# Patient Record
Sex: Female | Born: 1955 | Race: White | Hispanic: No | Marital: Married | Smoking: Never smoker
Health system: Southern US, Community
[De-identification: ages and names within clinical notes are randomized; demographics above are authoritative.]

## PROBLEM LIST (undated history)

## (undated) DIAGNOSIS — I10 Essential (primary) hypertension: Secondary | ICD-10-CM

## (undated) DIAGNOSIS — I509 Heart failure, unspecified: Secondary | ICD-10-CM

## (undated) DIAGNOSIS — E039 Hypothyroidism, unspecified: Secondary | ICD-10-CM

## (undated) HISTORY — DX: Essential (primary) hypertension: I10

---

## 2005-08-30 ENCOUNTER — Ambulatory Visit: Payer: Self-pay | Admitting: Urology

## 2007-02-07 ENCOUNTER — Ambulatory Visit: Payer: Self-pay | Admitting: Unknown Physician Specialty

## 2009-02-12 ENCOUNTER — Ambulatory Visit: Payer: Self-pay | Admitting: Urology

## 2010-02-07 ENCOUNTER — Ambulatory Visit: Payer: Self-pay | Admitting: Internal Medicine

## 2010-02-08 ENCOUNTER — Ambulatory Visit: Payer: Self-pay | Admitting: Family Medicine

## 2010-02-10 ENCOUNTER — Ambulatory Visit: Payer: Self-pay | Admitting: Internal Medicine

## 2010-02-14 ENCOUNTER — Ambulatory Visit: Payer: Self-pay | Admitting: Internal Medicine

## 2011-01-26 ENCOUNTER — Ambulatory Visit: Payer: Self-pay | Admitting: Urology

## 2016-03-08 ENCOUNTER — Other Ambulatory Visit: Payer: Self-pay

## 2016-03-08 DIAGNOSIS — M810 Age-related osteoporosis without current pathological fracture: Secondary | ICD-10-CM

## 2016-03-08 NOTE — Telephone Encounter (Signed)
Pt calling.  Has appt sched. 3/28.  Is on Evista for her bones.  She has changed jobs and rx has run out.  Can she have refill this this month?

## 2016-03-10 DIAGNOSIS — M810 Age-related osteoporosis without current pathological fracture: Secondary | ICD-10-CM | POA: Insufficient documentation

## 2016-03-10 MED ORDER — RALOXIFENE HCL 60 MG PO TABS: 60.0000 mg | ORAL_TABLET | Freq: Every day | ORAL | 0 refills | Status: DC

## 2016-03-10 NOTE — Addendum Note (Signed)
Addended by: Nadara MustardHARRIS, Raelea Gosse P on: 03/10/2016 01:11 PM   Modules accepted: Orders

## 2016-03-10 NOTE — Telephone Encounter (Signed)
[facility] [place]

## 2016-03-10 NOTE — Telephone Encounter (Signed)
No pharmacy so printed to fax once find out pharmacy

## 2016-03-10 NOTE — Telephone Encounter (Signed)
renewed

## 2016-03-31 ENCOUNTER — Encounter: Payer: Self-pay | Admitting: Obstetrics & Gynecology

## 2016-03-31 ENCOUNTER — Ambulatory Visit (INDEPENDENT_AMBULATORY_CARE_PROVIDER_SITE_OTHER): Payer: BC Managed Care – PPO | Admitting: Obstetrics & Gynecology

## 2016-03-31 VITALS — BP 140/90 | HR 66 | Ht 65.0 in | Wt 179.0 lb

## 2016-03-31 DIAGNOSIS — Z1239 Encounter for other screening for malignant neoplasm of breast: Secondary | ICD-10-CM

## 2016-03-31 DIAGNOSIS — Z1231 Encounter for screening mammogram for malignant neoplasm of breast: Secondary | ICD-10-CM

## 2016-03-31 DIAGNOSIS — M8580 Other specified disorders of bone density and structure, unspecified site: Secondary | ICD-10-CM

## 2016-03-31 DIAGNOSIS — M810 Age-related osteoporosis without current pathological fracture: Secondary | ICD-10-CM

## 2016-03-31 DIAGNOSIS — Z1211 Encounter for screening for malignant neoplasm of colon: Secondary | ICD-10-CM

## 2016-03-31 DIAGNOSIS — Z0000 Encounter for general adult medical examination without abnormal findings: Secondary | ICD-10-CM | POA: Diagnosis not present

## 2016-03-31 MED ORDER — RALOXIFENE HCL 60 MG PO TABS: 60.0000 mg | ORAL_TABLET | Freq: Every day | ORAL | 12 refills | Status: DC

## 2016-03-31 NOTE — Progress Notes (Signed)
HPI:      [name] is a 61 y.o. [id number] who LMP was in the past, she presents today for her annual examination.  The patient has no complaints today. The patient is sexually active. 2016 last pap: was normal. The patient is not taking hormone replacement therapy. Patient denies post-menopausal vaginal bleeding.   The patient has regular exercise: yes.   GYN Hx: Last Colonoscopy:9 years ago. Normal. Dr Markham Jordan Last DEXA: 3 years ago.  Osteopenia hip, normal spine.  PMHx: She  has no past medical history on file. Also,  has no past surgical history on file., family history includes Lung cancer in her mother.,  reports that she has never smoked. She has never used smokeless tobacco. She reports that she does not drink alcohol or use drugs.  She has a current medication list which includes the following prescription(s): calcium carbonate, cholecalciferol, garlic, omega-3 fatty acids, and raloxifene. Also, has No Known Allergies.  Review of Systems  Constitutional: Negative for chills, fever and malaise/fatigue.  HENT: Negative for congestion, sinus pain and sore throat.   Eyes: Negative for blurred vision and pain.  Respiratory: Negative for cough and wheezing.   Cardiovascular: Negative for chest pain and leg swelling.  Gastrointestinal: Negative for abdominal pain, constipation, diarrhea, heartburn, nausea and vomiting.  Genitourinary: Negative for dysuria, frequency, hematuria and urgency.  Musculoskeletal: Negative for back pain, joint pain, myalgias and neck pain.  Skin: Negative for itching and rash.  Neurological: Negative for dizziness, tremors and weakness.  Endo/Heme/Allergies: Does not bruise/bleed easily.  Psychiatric/Behavioral: Negative for depression. The patient is not nervous/anxious and does not have insomnia.     Objective: BP 140/90   Pulse 66   Ht 5\' 5"  (1.651 m)   Wt 179 lb (81.2 kg)   BMI 29.79 kg/m  Physical Exam  Constitutional: She is oriented  to person, place, and time. She appears well-developed and well-nourished. No distress.  Genitourinary: Rectum normal, vagina normal and uterus normal. Pelvic exam was performed with patient supine. There is no rash or lesion on the right labia. There is no rash or lesion on the left labia. Vagina exhibits no lesion. No bleeding in the vagina. Right adnexum does not display mass and does not display tenderness. Left adnexum does not display mass and does not display tenderness. Cervix does not exhibit motion tenderness, lesion, friability or polyp.   Uterus is mobile and midaxial. Uterus is not enlarged or exhibiting a mass.  HENT:  Head: Normocephalic and atraumatic. Head is without laceration.  Right Ear: Hearing normal.  Left Ear: Hearing normal.  Nose: No epistaxis.  No foreign bodies.  Mouth/Throat: Uvula is midline, oropharynx is clear and moist and mucous membranes are normal.  Eyes: Pupils are equal, round, and reactive to light.  Neck: Normal range of motion. Neck supple. No thyromegaly present.  Cardiovascular: Normal rate and regular rhythm.  Exam reveals no gallop and no friction rub.   No murmur heard. Pulmonary/Chest: Effort normal and breath sounds normal. No respiratory distress. She has no wheezes. Right breast exhibits no mass, no skin change and no tenderness. Left breast exhibits no mass, no skin change and no tenderness.  Abdominal: Soft. Bowel sounds are normal. She exhibits no distension. There is no tenderness. There is no rebound.  Musculoskeletal: Normal range of motion.  Neurological: She is alert and oriented to person, place, and time. No cranial nerve deficit.  Skin: Skin is warm and dry.  Psychiatric: She has a  normal mood and affect. Judgment normal.  Vitals reviewed.   Assessment: Annual Exam 1. Annual physical exam   2. Osteopenia, unspecified location   3. Osteoporosis, unspecified osteoporosis type, unspecified pathological fracture presence   4.  Screening for breast cancer   5. Screen for colon cancer    Plan:            1.  Cervical Screening-  Pap smear schedule reviewed with patient, due 2019  2. Breast screening- Exam annually and mammogram scheduled  3. Colonoscopy every 10 years (2019), Hemoccult testing after age 61  4. Labs  per PCP  5. Counseling for hormonal therapy: none  6. DEXA, if all normal then stop Evista.  Pros and cons Evista discussed.  Rx today until results.  Cont Vit D and Calcium.    F/U  Return in about 1 year (around 03/31/2017) for Annual.  Annamarie MajorPaul Dawon Troop, MD, Merlinda FrederickFACOG [facility], [facility] 03/31/2016  4:16 PM

## 2016-04-26 ENCOUNTER — Encounter: Payer: Self-pay | Admitting: Obstetrics & Gynecology

## 2016-05-18 ENCOUNTER — Telehealth: Payer: Self-pay

## 2016-05-18 NOTE — Telephone Encounter (Signed)
Pt calling for results from bone density scan. [facility] Please advise. cb# [phone] thank you!

## 2016-05-19 NOTE — Telephone Encounter (Signed)
Letter sent on 04/27/16 (confirmed w Tonya), did she get this?  Does she have correct address in system if not? Please check on this. Results- The results of your recent DEXA bone scan reveals similar findings to 3 years ago, with osteopenia in the hip and normal spine, and this is good news that it has not progressed, yet probably means you would still benefit from Evista therapy.    Recommendations are for continued regular exercise and adequate calcium and Vitamin D supplementation as well.

## 2016-05-19 NOTE — Telephone Encounter (Signed)
Left message for to call back. 

## 2016-05-20 NOTE — Telephone Encounter (Signed)
Pt aware.

## 2017-03-07 ENCOUNTER — Other Ambulatory Visit: Payer: Self-pay | Admitting: Obstetrics & Gynecology

## 2017-03-07 DIAGNOSIS — M810 Age-related osteoporosis without current pathological fracture: Secondary | ICD-10-CM

## 2017-07-19 ENCOUNTER — Other Ambulatory Visit: Payer: Self-pay | Admitting: Obstetrics & Gynecology

## 2017-07-19 ENCOUNTER — Telehealth: Payer: Self-pay | Admitting: Obstetrics & Gynecology

## 2017-07-19 NOTE — Progress Notes (Signed)
Mammogram results reviewed, reassured.  Letter sent.

## 2017-07-19 NOTE — Telephone Encounter (Signed)
Patient is [facility]

## 2017-07-19 NOTE — Telephone Encounter (Signed)
-----   Message from Nadara Mustardobert P Harris, MD sent at 07/19/2017  9:12 AM EDT ----- Regarding: [facility]

## 2017-08-01 ENCOUNTER — Encounter: Payer: Self-pay | Admitting: Obstetrics & Gynecology

## 2017-08-01 ENCOUNTER — Other Ambulatory Visit (HOSPITAL_COMMUNITY)
Admission: RE | Admit: 2017-08-01 | Discharge: 2017-08-01 | Disposition: A | Discharge status: Home / Self Care | Payer: BC Managed Care – PPO | Source: Ambulatory Visit | Attending: Obstetrics & Gynecology | Admitting: Obstetrics & Gynecology

## 2017-08-01 ENCOUNTER — Ambulatory Visit (INDEPENDENT_AMBULATORY_CARE_PROVIDER_SITE_OTHER): Payer: BC Managed Care – PPO | Admitting: Obstetrics & Gynecology

## 2017-08-01 VITALS — BP 158/90 | HR 79 | Ht 65.0 in | Wt 180.0 lb

## 2017-08-01 DIAGNOSIS — Z01411 Encounter for gynecological examination (general) (routine) with abnormal findings: Secondary | ICD-10-CM

## 2017-08-01 DIAGNOSIS — Z0000 Encounter for general adult medical examination without abnormal findings: Secondary | ICD-10-CM

## 2017-08-01 DIAGNOSIS — Z124 Encounter for screening for malignant neoplasm of cervix: Secondary | ICD-10-CM | POA: Insufficient documentation

## 2017-08-01 DIAGNOSIS — M810 Age-related osteoporosis without current pathological fracture: Secondary | ICD-10-CM

## 2017-08-01 DIAGNOSIS — Z1239 Encounter for other screening for malignant neoplasm of breast: Secondary | ICD-10-CM

## 2017-08-01 DIAGNOSIS — Z1231 Encounter for screening mammogram for malignant neoplasm of breast: Secondary | ICD-10-CM | POA: Diagnosis not present

## 2017-08-01 MED ORDER — RALOXIFENE HCL 60 MG PO TABS: 60.0000 mg | ORAL_TABLET | Freq: Every day | ORAL | 4 refills | Status: DC

## 2017-08-01 NOTE — Progress Notes (Signed)
HPI:      [name] is a 62 y.o. [id number] who LMP was in the past, she presents today for her annual examination.  The patient has no complaints today. The patient is sexually active. Herlast pap: approximate date 2016 and was normal and last mammogram: approximate date 2019 and was normal.  The patient does perform self breast exams.  There is no notable family history of breast or ovarian cancer in her family. The patient is not taking hormone replacement therapy. Patient denies post-menopausal vaginal bleeding.   The patient has regular exercise: yes. The patient denies current symptoms of depression.    GYN Hx: Last Colonoscopy:10 years ago. Normal.  Last DEXA: year ago.    PMHx: Past Medical History:  Diagnosis Date  . Hypertension    History reviewed. No pertinent surgical history. Family History  Problem Relation Age of Onset  . Lung cancer Mother    Social History   Tobacco Use  . Smoking status: Never Smoker  . Smokeless tobacco: Never Used  Substance Use Topics  . Alcohol use: No  . Drug use: No    Current Outpatient Medications:  .  calcium carbonate (CALCIUM 600) 600 MG TABS tablet, Take by mouth., Disp: , Rfl:  .  Cholecalciferol (VITAMIN D-1000 MAX ST) 1000 units tablet, Take by mouth., Disp: , Rfl:  .  Garlic 10 MG CAPS, Take by mouth., Disp: , Rfl:  .  hydrochlorothiazide (HYDRODIURIL) 25 MG tablet, Take by mouth., Disp: , Rfl:  .  Omega-3 Fatty Acids (FISH OIL PO), Take by mouth., Disp: , Rfl:  .  raloxifene (EVISTA) 60 MG tablet, Take 1 tablet (60 mg total) by mouth daily., Disp: 90 tablet, Rfl: 4 .  Cyanocobalamin (B-12) 1000 MCG LOZG, Place under the tongue., Disp: , Rfl:  Allergies: Patient has no known allergies.  Review of Systems  Constitutional: Negative for chills, fever and malaise/fatigue.  HENT: Negative for congestion, sinus pain and sore throat.   Eyes: Negative for blurred vision and pain.  Respiratory: Negative for cough and  wheezing.   Cardiovascular: Negative for chest pain and leg swelling.  Gastrointestinal: Negative for abdominal pain, constipation, diarrhea, heartburn, nausea and vomiting.  Genitourinary: Negative for dysuria, frequency, hematuria and urgency.  Musculoskeletal: Negative for back pain, joint pain, myalgias and neck pain.  Skin: Negative for itching and rash.  Neurological: Negative for dizziness, tremors and weakness.  Endo/Heme/Allergies: Does not bruise/bleed easily.  Psychiatric/Behavioral: Negative for depression. The patient is not nervous/anxious and does not have insomnia.    Objective: BP (!) 158/90   Pulse 79   Ht 5\' 5"  (1.651 m)   Wt 180 lb (81.6 kg)   BMI 29.95 kg/m   Filed Weights   08/01/17 0805  Weight: 180 lb (81.6 kg)   Body mass index is 29.95 kg/m. Physical Exam  Constitutional: She is oriented to person, place, and time. She appears well-developed and well-nourished. No distress.  Genitourinary: Rectum normal, vagina normal and uterus normal. Pelvic exam was performed with patient supine. There is no rash or lesion on the right labia. There is no rash or lesion on the left labia. Vagina exhibits no lesion. No bleeding in the vagina. Right adnexum does not display mass and does not display tenderness. Left adnexum does not display mass and does not display tenderness. Cervix does not exhibit motion tenderness, lesion, friability or polyp.   Uterus is mobile and midaxial. Uterus is not enlarged or exhibiting a mass.  HENT:  Head: Normocephalic and atraumatic. Head is without laceration.  Right Ear: Hearing normal.  Left Ear: Hearing normal.  Nose: No epistaxis.  No foreign bodies.  Mouth/Throat: Uvula is midline, oropharynx is clear and moist and mucous membranes are normal.  Eyes: Pupils are equal, round, and reactive to light.  Neck: Normal range of motion. Neck supple. No thyromegaly present.  Cardiovascular: Normal rate and regular rhythm. Exam reveals no  gallop and no friction rub.  No murmur heard. Pulmonary/Chest: Effort normal and breath sounds normal. No respiratory distress. She has no wheezes. Right breast exhibits no mass, no skin change and no tenderness. Left breast exhibits no mass, no skin change and no tenderness.  Abdominal: Soft. Bowel sounds are normal. She exhibits no distension. There is no tenderness. There is no rebound.  Musculoskeletal: Normal range of motion.  Neurological: She is alert and oriented to person, place, and time. No cranial nerve deficit.  Skin: Skin is warm and dry.  Psychiatric: She has a normal mood and affect. Judgment normal.  Vitals reviewed.  Assessment:  1. Annual physical exam   2. Screening for cervical cancer   3. Screening for breast cancer   4. Osteoporosis, unspecified osteoporosis type, unspecified pathological fracture presence    Plan:            1.  Cervical Screening-  Pap smear done today  2. Breast screening- Exam annually and mammogram scheduled  3. Colonoscopy every 10 years, Hemoccult testing after age 62  4. Labs managed by PCP  5. Counseling for hormonal therapy: none  6. White coat HTN.  Sees PCP.  Recent HCTZ.  7. Osteopenia.  DEXA last year.  Cont Evista. No SE or concerns.     F/U  Return in about 1 year (around 08/02/2018) for Annual.  Annamarie MajorPaul Ocean Schildt, MD, Merlinda FrederickFACOG [facility], [facility] 08/01/2017  8:23 AM

## 2017-08-01 NOTE — Patient Instructions (Addendum)
PAP every three years Mammogram every year Colonoscopy every 10 years Labs yearly (with PCP)   

## 2017-08-03 LAB — CYTOLOGY - PAP
DIAGNOSIS: NEGATIVE
HPV (WINDOPATH): NOT DETECTED

## 2018-08-14 ENCOUNTER — Ambulatory Visit (INDEPENDENT_AMBULATORY_CARE_PROVIDER_SITE_OTHER): Payer: BC Managed Care – PPO | Admitting: Obstetrics & Gynecology

## 2018-08-14 ENCOUNTER — Other Ambulatory Visit: Payer: Self-pay

## 2018-08-14 ENCOUNTER — Encounter: Payer: Self-pay | Admitting: Obstetrics & Gynecology

## 2018-08-14 VITALS — BP 130/90 | Ht 66.0 in | Wt 197.0 lb

## 2018-08-14 DIAGNOSIS — Z01419 Encounter for gynecological examination (general) (routine) without abnormal findings: Secondary | ICD-10-CM

## 2018-08-14 DIAGNOSIS — M810 Age-related osteoporosis without current pathological fracture: Secondary | ICD-10-CM

## 2018-08-14 DIAGNOSIS — M8580 Other specified disorders of bone density and structure, unspecified site: Secondary | ICD-10-CM

## 2018-08-14 DIAGNOSIS — Z1211 Encounter for screening for malignant neoplasm of colon: Secondary | ICD-10-CM

## 2018-08-14 MED ORDER — RALOXIFENE HCL 60 MG PO TABS: 60.0000 mg | ORAL_TABLET | Freq: Every day | ORAL | 3 refills | Status: DC

## 2018-08-14 NOTE — Patient Instructions (Signed)
PAP every three years Mammogram every year, normal this year Colonoscopy every 10 years, up to date 2019 Labs yearly (with PCP)

## 2018-08-14 NOTE — Progress Notes (Signed)
HPI:      [name] is a 63 y.o. [id number] who LMP was in the past, she presents today for her annual examination.  The patient has no complaints today. The patient is sexually active. Herlast pap: approximate date 2019 and was normal and last mammogram: approximate date 2020 and was normal.  The patient does perform self breast exams.  There is no notable family history of breast or ovarian cancer in her family. The patient is not taking hormone replacement therapy. Patient denies post-menopausal vaginal bleeding.   The patient has regular exercise: yes. The patient denies current symptoms of depression.    GYN Hx: Last Colonoscopy:1 year ago. Normal.  Last DEXA: 2 years ago.    PMHx: Past Medical History:  Diagnosis Date  . Hypertension    History reviewed. No pertinent surgical history. Family History  Problem Relation Age of Onset  . Lung cancer Mother    Social History   Tobacco Use  . Smoking status: Never Smoker  . Smokeless tobacco: Never Used  Substance Use Topics  . Alcohol use: No  . Drug use: No    Current Outpatient Medications:  .  calcium carbonate (CALCIUM 600) 600 MG TABS tablet, Take by mouth., Disp: , Rfl:  .  Cholecalciferol (VITAMIN D-1000 MAX ST) 1000 units tablet, Take by mouth., Disp: , Rfl:  .  hydrochlorothiazide (HYDRODIURIL) 25 MG tablet, Take by mouth., Disp: , Rfl:  .  metoprolol succinate (TOPROL-XL) 50 MG 24 hr tablet, Take by mouth., Disp: , Rfl:  .  raloxifene (EVISTA) 60 MG tablet, Take 1 tablet (60 mg total) by mouth daily., Disp: 90 tablet, Rfl: 3 .  Cyanocobalamin (B-12) 1000 MCG LOZG, Place under the tongue., Disp: , Rfl:  .  Garlic 10 MG CAPS, Take by mouth., Disp: , Rfl:  .  hydrochlorothiazide (HYDRODIURIL) 25 MG tablet, Take by mouth., Disp: , Rfl:  .  Omega-3 Fatty Acids (FISH OIL PO), Take by mouth., Disp: , Rfl:  Allergies: Patient has no known allergies.  Review of Systems  Constitutional: Negative for chills, fever  and malaise/fatigue.  HENT: Negative for congestion, sinus pain and sore throat.   Eyes: Negative for blurred vision and pain.  Respiratory: Negative for cough and wheezing.   Cardiovascular: Negative for chest pain and leg swelling.  Gastrointestinal: Negative for abdominal pain, constipation, diarrhea, heartburn, nausea and vomiting.  Genitourinary: Negative for dysuria, frequency, hematuria and urgency.  Musculoskeletal: Negative for back pain, joint pain, myalgias and neck pain.  Skin: Negative for itching and rash.  Neurological: Negative for dizziness, tremors and weakness.  Endo/Heme/Allergies: Does not bruise/bleed easily.  Psychiatric/Behavioral: Negative for depression. The patient is not nervous/anxious and does not have insomnia.     Objective: BP 130/90   Ht 5\' 6"  (1.676 m)   Wt 197 lb (89.4 kg)   BMI 31.80 kg/m   Filed Weights   08/14/18 0805  Weight: 197 lb (89.4 kg)   Body mass index is 31.8 kg/m. Physical Exam Constitutional:      General: She is not in acute distress.    Appearance: She is well-developed.  Genitourinary:     Pelvic exam was performed with patient supine.     Vagina, uterus and rectum normal.     No lesions in the vagina.     No vaginal bleeding.     No cervical motion tenderness, friability, lesion or polyp.     Uterus is mobile.     Uterus  is not enlarged.     No uterine mass detected.    Uterus is midaxial.     No right or left adnexal mass present.     Right adnexa not tender.     Left adnexa not tender.  HENT:     Head: Normocephalic and atraumatic. No laceration.     Right Ear: Hearing normal.     Left Ear: Hearing normal.     Mouth/Throat:     Pharynx: Uvula midline.  Eyes:     Pupils: Pupils are equal, round, and reactive to light.  Neck:     Musculoskeletal: Normal range of motion and neck supple.     Thyroid: No thyromegaly.  Cardiovascular:     Rate and Rhythm: Normal rate and regular rhythm.     Heart sounds: No  murmur. No friction rub. No gallop.   Pulmonary:     Effort: Pulmonary effort is normal. No respiratory distress.     Breath sounds: Normal breath sounds. No wheezing.  Chest:     Breasts:        Right: No mass, skin change or tenderness.        Left: No mass, skin change or tenderness.  Abdominal:     General: Bowel sounds are normal. There is no distension.     Palpations: Abdomen is soft.     Tenderness: There is no abdominal tenderness. There is no rebound.  Musculoskeletal: Normal range of motion.  Neurological:     Mental Status: She is alert and oriented to person, place, and time.     Cranial Nerves: No cranial nerve deficit.  Skin:    General: Skin is warm and dry.  Psychiatric:        Judgment: Judgment normal.  Vitals signs reviewed.     Assessment: Annual Exam 1. Women's annual routine gynecological examination   2. Screen for colon cancer   3. Osteopenia, unspecified location   4. Osteoporosis, unspecified osteoporosis type, unspecified pathological fracture presence     Plan:            1.  Cervical Screening-  Pap smear schedule reviewed with patient  2. Breast screening- Exam annually and mammogram scheduled  3. Colonoscopy every 10 years, Hemoccult testing after age 63  4. Labs managed by PCP  5. Counseling for hormonal therapy: no change in therapy today              6. FRAX - FRAX score for assessing the 10 year probability for fracture calculated and discussed today.  Based on age and score today, DEXA is not currently scheduled.   Will scheduled next year.   7. Osteopenia, unspecified location - raloxifene (EVISTA) 60 MG tablet; Take 1 tablet (60 mg total) by mouth daily.  Dispense: 90 tablet; Refill: 3 - Cont this therapy as has helped over recent years.  Recheck DEXA next year (pt prefers to wait due to [place], and this is OK as we are on maintenance therapy that has been proven to work thru past DEXA (5 yrs ago, then 2 yrs ago)   8.  HTN, sees  PCP Dr Hyacinth MeekerMiller, on trial of medicine for this    F/U  Return in about 1 year (around 08/14/2019) for Annual.  Annamarie MajorPaul Patrich Heinze, MD, Merlinda FrederickFACOG [facility], [facility] 08/14/2018  8:25 AM

## 2018-10-11 ENCOUNTER — Ambulatory Visit
Admission: RE | Admit: 2018-10-11 | Discharge: 2018-10-11 | Disposition: A | Discharge status: Home / Self Care | Payer: BC Managed Care – PPO | Source: Ambulatory Visit | Attending: Internal Medicine | Admitting: Internal Medicine

## 2018-10-11 ENCOUNTER — Other Ambulatory Visit (HOSPITAL_COMMUNITY): Payer: Self-pay | Admitting: Internal Medicine

## 2018-10-11 ENCOUNTER — Other Ambulatory Visit: Payer: Self-pay | Admitting: Internal Medicine

## 2018-10-11 ENCOUNTER — Other Ambulatory Visit: Payer: Self-pay

## 2018-10-11 DIAGNOSIS — R1011 Right upper quadrant pain: Secondary | ICD-10-CM | POA: Insufficient documentation

## 2018-10-20 ENCOUNTER — Other Ambulatory Visit: Payer: Self-pay | Admitting: Internal Medicine

## 2018-10-20 DIAGNOSIS — R1011 Right upper quadrant pain: Secondary | ICD-10-CM

## 2018-10-26 ENCOUNTER — Other Ambulatory Visit: Payer: Self-pay

## 2018-10-26 ENCOUNTER — Encounter
Admission: RE | Admit: 2018-10-26 | Discharge: 2018-10-26 | Disposition: A | Discharge status: Home / Self Care | Payer: BC Managed Care – PPO | Source: Ambulatory Visit | Attending: Internal Medicine | Admitting: Internal Medicine

## 2018-10-26 DIAGNOSIS — R1011 Right upper quadrant pain: Secondary | ICD-10-CM | POA: Insufficient documentation

## 2018-10-26 MED ORDER — TECHNETIUM TC 99M MEBROFENIN IV KIT
5.0000 | PACK | Freq: Once | INTRAVENOUS | Status: AC | PRN
  Administered 2018-10-26: 13:00:00 5.09 via INTRAVENOUS

## 2019-07-03 ENCOUNTER — Other Ambulatory Visit: Payer: Self-pay | Admitting: Pulmonary Disease

## 2019-07-10 ENCOUNTER — Other Ambulatory Visit (HOSPITAL_COMMUNITY): Payer: Self-pay | Admitting: Pulmonary Disease

## 2019-07-10 ENCOUNTER — Other Ambulatory Visit: Payer: Self-pay | Admitting: Pulmonary Disease

## 2019-07-10 DIAGNOSIS — J849 Interstitial pulmonary disease, unspecified: Secondary | ICD-10-CM

## 2019-07-20 ENCOUNTER — Other Ambulatory Visit: Payer: Self-pay

## 2019-07-20 ENCOUNTER — Ambulatory Visit
Admission: RE | Admit: 2019-07-20 | Discharge: 2019-07-20 | Disposition: A | Discharge status: Home / Self Care | Payer: BC Managed Care – PPO | Source: Ambulatory Visit | Attending: Pulmonary Disease | Admitting: Pulmonary Disease

## 2019-07-20 DIAGNOSIS — J849 Interstitial pulmonary disease, unspecified: Secondary | ICD-10-CM

## 2019-07-20 MED ORDER — IOHEXOL 300 MG/ML  SOLN
75.0000 mL | Freq: Once | INTRAMUSCULAR | Status: AC | PRN
  Administered 2019-07-20: 75 mL via INTRAVENOUS

## 2019-10-02 ENCOUNTER — Other Ambulatory Visit: Payer: Self-pay

## 2019-10-02 ENCOUNTER — Encounter: Payer: Self-pay | Admitting: Obstetrics & Gynecology

## 2019-10-02 ENCOUNTER — Ambulatory Visit (INDEPENDENT_AMBULATORY_CARE_PROVIDER_SITE_OTHER): Payer: BC Managed Care – PPO | Admitting: Obstetrics & Gynecology

## 2019-10-02 VITALS — BP 130/98 | Ht 65.0 in | Wt 194.0 lb

## 2019-10-02 DIAGNOSIS — Z01419 Encounter for gynecological examination (general) (routine) without abnormal findings: Secondary | ICD-10-CM | POA: Diagnosis not present

## 2019-10-02 DIAGNOSIS — M810 Age-related osteoporosis without current pathological fracture: Secondary | ICD-10-CM

## 2019-10-02 NOTE — Progress Notes (Signed)
HPI:      [name] is a 64 y.o. [id number] who LMP was in the past, she presents today for her annual examination.  The patient has no complaints today. The patient is sexually active. Herlast pap: approximate date 2019 and was normal and last mammogram: approximate date 2021 and was normal and this was at [facility].  The patient does perform self breast exams.  There is no notable family history of breast or ovarian cancer in her family. The patient is not taking hormone replacement therapy. Patient denies post-menopausal vaginal bleeding.   The patient has regular exercise: yes. The patient denies current symptoms of depression.    GYN Hx: Last Colonoscopy:2 years ago. Normal. Due every 5 yrs Last DEXA: 3 years ago.  On Evista for osteoporosis.  PMHx: Past Medical History:  Diagnosis Date  . Hypertension    History reviewed. No pertinent surgical history. Family History  Problem Relation Age of Onset  . Lung cancer Mother    Social History   Tobacco Use  . Smoking status: Never Smoker  . Smokeless tobacco: Never Used  Vaping Use  . Vaping Use: Never used  Substance Use Topics  . Alcohol use: No  . Drug use: No    Current Outpatient Medications:  .  calcium carbonate (CALCIUM 600) 600 MG TABS tablet, Take by mouth., Disp: , Rfl:  .  Cholecalciferol (VITAMIN D-1000 MAX ST) 1000 units tablet, Take by mouth., Disp: , Rfl:  .  Cyanocobalamin (B-12) 1000 MCG LOZG, Place under the tongue., Disp: , Rfl:  .  Garlic 10 MG CAPS, Take by mouth., Disp: , Rfl:  .  hydrochlorothiazide (HYDRODIURIL) 25 MG tablet, Take by mouth., Disp: , Rfl:  .  levothyroxine (SYNTHROID) 50 MCG tablet, Take by mouth., Disp: , Rfl:  .  raloxifene (EVISTA) 60 MG tablet, Take 1 tablet (60 mg total) by mouth daily., Disp: 90 tablet, Rfl: 3 .  hydrochlorothiazide (HYDRODIURIL) 25 MG tablet, Take by mouth., Disp: , Rfl:  .  metoprolol succinate (TOPROL-XL) 50 MG 24 hr tablet, Take by mouth., Disp: , Rfl:    .  Omega-3 Fatty Acids (FISH OIL PO), Take by mouth. (Patient not taking: Reported on 10/02/2019), Disp: , Rfl:  Allergies: Patient has no known allergies.  Review of Systems  Constitutional: Negative for chills, fever and malaise/fatigue.  HENT: Negative for congestion, sinus pain and sore throat.   Eyes: Negative for blurred vision and pain.  Respiratory: Negative for cough and wheezing.   Cardiovascular: Negative for chest pain and leg swelling.  Gastrointestinal: Negative for abdominal pain, constipation, diarrhea, heartburn, nausea and vomiting.  Genitourinary: Negative for dysuria, frequency, hematuria and urgency.  Musculoskeletal: Negative for back pain, joint pain, myalgias and neck pain.  Skin: Negative for itching and rash.  Neurological: Negative for dizziness, tremors and weakness.  Endo/Heme/Allergies: Does not bruise/bleed easily.  Psychiatric/Behavioral: Negative for depression. The patient is not nervous/anxious and does not have insomnia.     Objective: BP (!) 130/98   Ht 5\' 5"  (1.651 m)   Wt 194 lb (88 kg)   BMI 32.28 kg/m   Filed Weights   10/02/19 1516  Weight: 194 lb (88 kg)   Body mass index is 32.28 kg/m. Physical Exam Constitutional:      General: She is not in acute distress.    Appearance: She is well-developed.  Genitourinary:     Pelvic exam was performed with patient supine.     Vagina, uterus and  rectum normal.     No lesions in the vagina.     No vaginal bleeding.     No cervical motion tenderness, friability, lesion or polyp.     Uterus is mobile.     Uterus is not enlarged.     No uterine mass detected.    Uterus is midaxial.     No right or left adnexal mass present.     Right adnexa not tender.     Left adnexa not tender.  HENT:     Head: Normocephalic and atraumatic. No laceration.     Right Ear: Hearing normal.     Left Ear: Hearing normal.     Mouth/Throat:     Pharynx: Uvula midline.  Eyes:     Pupils: Pupils are equal,  round, and reactive to light.  Neck:     Thyroid: No thyromegaly.  Cardiovascular:     Rate and Rhythm: Normal rate and regular rhythm.     Heart sounds: No murmur heard.  No friction rub. No gallop.   Pulmonary:     Effort: Pulmonary effort is normal. No respiratory distress.     Breath sounds: Normal breath sounds. No wheezing.  Chest:     Breasts:        Right: No mass, skin change or tenderness.        Left: No mass, skin change or tenderness.  Abdominal:     General: Bowel sounds are normal. There is no distension.     Palpations: Abdomen is soft.     Tenderness: There is no abdominal tenderness. There is no rebound.  Musculoskeletal:        General: Normal range of motion.     Cervical back: Normal range of motion and neck supple.  Neurological:     Mental Status: She is alert and oriented to person, place, and time.     Cranial Nerves: No cranial nerve deficit.  Skin:    General: Skin is warm and dry.  Psychiatric:        Judgment: Judgment normal.  Vitals reviewed.     Assessment: Annual Exam 1. Women's annual routine gynecological examination   2. Osteoporosis, unspecified osteoporosis type, unspecified pathological fracture presence     Plan:            1.  Cervical Screening-  Pap smear schedule reviewed with patient, Pap smear to be scheduled next year  2. Breast screening- Exam annually and mammogram scheduled  3. Colonoscopy every 5 years, Hemoccult testing after age 49 Declines Hemoccult testing due to difficulties w ins coverage of this test last year  4. Labs managed by PCP  5. Counseling for hormonal therapy: none              6. FRAX - FRAX score for assessing the 10 year probability for fracture calculated and discussed today.  Based on age and score today, DEXA is scheduled.  Prefers same place as last test [facility])    F/U  Return in about 1 year (around 10/01/2020) for Annual.  Annamarie Major, MD, [facility], [facility] 10/02/2019  3:58 PM

## 2019-10-09 ENCOUNTER — Telehealth: Payer: Self-pay | Admitting: Obstetrics & Gynecology

## 2019-10-09 NOTE — Telephone Encounter (Signed)
Called pt to confirm location of last DEXA. She adv that it was at [facility] in [place]. I contacted [facility] and confirmed the location and rec'd the fax number for that location, [phone].  I faxed the order, notes and ins info and asked them to contact the patient to schedule her appt.  Pt did adv that with her insurance her responsibility was $80 vs $1200 at [facility].

## 2019-10-22 ENCOUNTER — Encounter: Payer: Self-pay | Admitting: Obstetrics & Gynecology

## 2019-10-26 ENCOUNTER — Other Ambulatory Visit: Payer: Self-pay | Admitting: Obstetrics & Gynecology

## 2019-10-31 NOTE — Telephone Encounter (Signed)
Pt calling for bone density results - okay to leave vm.  [phone]  Pt aware of PH's msg.  Needs refill of Evista.  Pharm correct in chart.  Pt aware PH in office tomorrow.

## 2019-11-01 ENCOUNTER — Other Ambulatory Visit: Payer: Self-pay | Admitting: Obstetrics & Gynecology

## 2019-11-01 DIAGNOSIS — M810 Age-related osteoporosis without current pathological fracture: Secondary | ICD-10-CM

## 2019-11-01 MED ORDER — RALOXIFENE HCL 60 MG PO TABS: 60.0000 mg | ORAL_TABLET | Freq: Every day | ORAL | 3 refills | Status: DC

## 2019-11-01 NOTE — Telephone Encounter (Signed)
Pt aware.

## 2019-11-01 NOTE — Telephone Encounter (Signed)
Let her know results show improvement in bone mass but still on low side, would continue with Evista (refills called in), Calcium, and Vit D daily.

## 2020-09-05 ENCOUNTER — Other Ambulatory Visit: Payer: Self-pay | Admitting: Family Medicine

## 2020-09-05 ENCOUNTER — Other Ambulatory Visit: Payer: Self-pay

## 2020-09-05 ENCOUNTER — Ambulatory Visit
Admission: RE | Admit: 2020-09-05 | Discharge: 2020-09-05 | Disposition: A | Discharge status: Home / Self Care | Payer: Medicare HMO | Source: Ambulatory Visit | Attending: Family Medicine | Admitting: Family Medicine

## 2020-09-05 DIAGNOSIS — R1031 Right lower quadrant pain: Secondary | ICD-10-CM | POA: Diagnosis not present

## 2020-09-05 MED ORDER — IOHEXOL 350 MG/ML SOLN
75.0000 mL | Freq: Once | INTRAVENOUS | Status: AC | PRN
  Administered 2020-09-05: 75 mL via INTRAVENOUS

## 2020-10-03 ENCOUNTER — Ambulatory Visit (INDEPENDENT_AMBULATORY_CARE_PROVIDER_SITE_OTHER): Payer: Medicare HMO | Admitting: Obstetrics & Gynecology

## 2020-10-03 ENCOUNTER — Other Ambulatory Visit: Payer: Self-pay

## 2020-10-03 ENCOUNTER — Encounter: Payer: Self-pay | Admitting: Obstetrics & Gynecology

## 2020-10-03 ENCOUNTER — Other Ambulatory Visit (HOSPITAL_COMMUNITY)
Admission: RE | Admit: 2020-10-03 | Discharge: 2020-10-03 | Disposition: A | Discharge status: Home / Self Care | Payer: Medicare HMO | Source: Ambulatory Visit | Attending: Obstetrics & Gynecology | Admitting: Obstetrics & Gynecology

## 2020-10-03 VITALS — BP 120/80 | Ht 64.0 in | Wt 172.0 lb

## 2020-10-03 DIAGNOSIS — Z01419 Encounter for gynecological examination (general) (routine) without abnormal findings: Secondary | ICD-10-CM | POA: Insufficient documentation

## 2020-10-03 DIAGNOSIS — Z124 Encounter for screening for malignant neoplasm of cervix: Secondary | ICD-10-CM

## 2020-10-03 DIAGNOSIS — Z1151 Encounter for screening for human papillomavirus (HPV): Secondary | ICD-10-CM | POA: Diagnosis not present

## 2020-10-03 DIAGNOSIS — M810 Age-related osteoporosis without current pathological fracture: Secondary | ICD-10-CM

## 2020-10-03 MED ORDER — RALOXIFENE HCL 60 MG PO TABS: 60.0000 mg | ORAL_TABLET | Freq: Every day | ORAL | 3 refills | Status: AC

## 2020-10-03 NOTE — Patient Instructions (Signed)
Recommendations to boost your immunity to prevent illness such as viral flu and colds, including covid19, are as follows: °      - - -  Vitamin K2 and Vitamin D3  - - - °Take Vitamin K2 at 200-300 mcg daily (usually 2-3 pills daily of the over the counter formulation). °Take Vitamin D3 at 3000-4000 U daily (usually 3-4 pills daily of the over the counter formulation). °Studies show that these two at high normal levels in your system are very effective in keeping your immunity so strong and protective that you will be unlikely to contract viral illness such as those listed above. ° °Dr Aundria Bitterman ° °

## 2020-10-03 NOTE — Progress Notes (Signed)
HPI:      [name] is a 65 y.o. [id number] who LMP was in the past, she presents today for her annual examination.  The patient has no complaints today. The patient is sexually active. Herlast pap: approximate date 2019 and was normal and last mammogram: approximate date 08/2020 and was normal.  The patient does perform self breast exams.  There is no notable family history of breast or ovarian cancer in her family. The patient is not taking hormone replacement therapy. Patient denies post-menopausal vaginal bleeding.   The patient has regular exercise: yes. The patient denies current symptoms of depression.    GYN Hx: Last Colonoscopy:3 years ago. Normal.  Last DEXA 2021.  PMHx: Past Medical History:  Diagnosis Date   Hypertension    History reviewed. No pertinent surgical history. Family History  Problem Relation Age of Onset   Lung cancer Mother    Social History   Tobacco Use   Smoking status: Never   Smokeless tobacco: Never  Vaping Use   Vaping Use: Never used  Substance Use Topics   Alcohol use: No   Drug use: No    Current Outpatient Medications:    calcium carbonate (OS-CAL) 600 MG TABS tablet, Take by mouth., Disp: , Rfl:    Cholecalciferol 25 MCG (1000 UT) tablet, Take by mouth., Disp: , Rfl:    Cyanocobalamin (B-12) 1000 MCG LOZG, Place under the tongue., Disp: , Rfl:    Garlic 10 MG CAPS, Take by mouth., Disp: , Rfl:    Omega-3 Fatty Acids (FISH OIL PO), Take by mouth., Disp: , Rfl:    triamterene-hydrochlorothiazide (DYAZIDE) 37.5-25 MG capsule, Take 1 capsule by mouth every morning., Disp: , Rfl:    hydrochlorothiazide (HYDRODIURIL) 25 MG tablet, Take by mouth., Disp: , Rfl:    levothyroxine (SYNTHROID) 50 MCG tablet, Take by mouth., Disp: , Rfl:    metoprolol succinate (TOPROL-XL) 50 MG 24 hr tablet, Take by mouth., Disp: , Rfl:    raloxifene (EVISTA) 60 MG tablet, Take 1 tablet (60 mg total) by mouth daily., Disp: 90 tablet, Rfl: 3 Allergies:  Patient has no known allergies.  Review of Systems  Constitutional:  Negative for chills, fever and malaise/fatigue.  HENT:  Negative for congestion, sinus pain and sore throat.   Eyes:  Negative for blurred vision and pain.  Respiratory:  Negative for cough and wheezing.   Cardiovascular:  Negative for chest pain and leg swelling.  Gastrointestinal:  Negative for abdominal pain, constipation, diarrhea, heartburn, nausea and vomiting.  Genitourinary:  Negative for dysuria, frequency, hematuria and urgency.  Musculoskeletal:  Negative for back pain, joint pain, myalgias and neck pain.  Skin:  Negative for itching and rash.  Neurological:  Negative for dizziness, tremors and weakness.  Endo/Heme/Allergies:  Does not bruise/bleed easily.  Psychiatric/Behavioral:  Negative for depression. The patient is not nervous/anxious and does not have insomnia.    Objective: BP 120/80   Ht 5\' 4"  (1.626 m)   Wt 172 lb (78 kg)   BMI 29.52 kg/m   Filed Weights   10/03/20 1435  Weight: 172 lb (78 kg)   Body mass index is 29.52 kg/m. Physical Exam Constitutional:      General: She is not in acute distress.    Appearance: She is well-developed.  Genitourinary:     Bladder, rectum and urethral meatus normal.     No lesions in the vagina.     Right Labia: No rash, tenderness or lesions.  Left Labia: No tenderness, lesions or rash.    No vaginal bleeding.      Right Adnexa: not tender and no mass present.    Left Adnexa: not tender and no mass present.    No cervical motion tenderness, friability, lesion or polyp.     Uterus is not enlarged.     No uterine mass detected.    Pelvic exam was performed with patient in the lithotomy position.  Breasts:    Right: No mass, skin change or tenderness.     Left: No mass, skin change or tenderness.  HENT:     Head: Normocephalic and atraumatic. No laceration.     Right Ear: Hearing normal.     Left Ear: Hearing normal.     Mouth/Throat:      Pharynx: Uvula midline.  Eyes:     Pupils: Pupils are equal, round, and reactive to light.  Neck:     Thyroid: No thyromegaly.  Cardiovascular:     Rate and Rhythm: Normal rate and regular rhythm.     Heart sounds: No murmur heard.   No friction rub. No gallop.  Pulmonary:     Effort: Pulmonary effort is normal. No respiratory distress.     Breath sounds: Normal breath sounds. No wheezing.  Abdominal:     General: Bowel sounds are normal. There is no distension.     Palpations: Abdomen is soft.     Tenderness: There is no abdominal tenderness. There is no rebound.  Musculoskeletal:        General: Normal range of motion.     Cervical back: Normal range of motion and neck supple.  Neurological:     Mental Status: She is alert and oriented to person, place, and time.     Cranial Nerves: No cranial nerve deficit.  Skin:    General: Skin is warm and dry.  Psychiatric:        Judgment: Judgment normal.  Vitals reviewed.    Assessment: Annual Exam 1. Women's annual routine gynecological examination   2. Osteoporosis, unspecified osteoporosis type, unspecified pathological fracture presence   3. Screening for malignant neoplasm of cervix     Plan:            1.  Cervical Screening-  Pap smear done today  2. Breast screening- Exam annually and mammogram scheduled  3. Colonoscopy every 10 years, Hemoccult testing after age 46  4. Labs managed by PCP  5. Counseling for hormonal therapy: none CONT EVISTA for osteoporosis prevention              6. FRAX - FRAX score for assessing the 10 year probability for fracture calculated and discussed today.  Based on age and score today, DEXA is not currently scheduled.  Due 2023.    F/U  Return in about 1 year (around 10/03/2021) for Annual.  Mary Major, MD, [facility], [facility] 10/03/2020  3:20 PM

## 2020-10-07 LAB — CYTOLOGY - PAP
Comment: NEGATIVE
Diagnosis: NEGATIVE
High risk HPV: NEGATIVE

## 2021-02-24 IMAGING — CT CT CHEST W/ CM
1 series · 15 of 34 positions shown, 19 images · IV contrast (omnipaque)
Comparison: None.

CLINICAL DATA: Shortness of breath with exertion over a few years
time.

EXAM:
CT CHEST WITH CONTRAST
TECHNIQUE: Multidetector CT imaging of the chest was performed during
intravenous contrast administration.
CONTRAST:  75mL OMNIPAQUE IOHEXOL 300 MG/ML  SOLN

[Series 2: axial st · axial · 0.64mm/px · z∈[-688,-412]mm · 15 of 162 slices shown, 19 images]
[im 12/162  mediastinal]
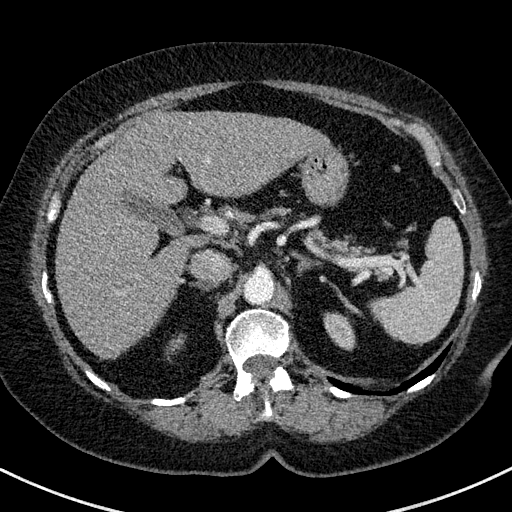
[im 12/162  lung]
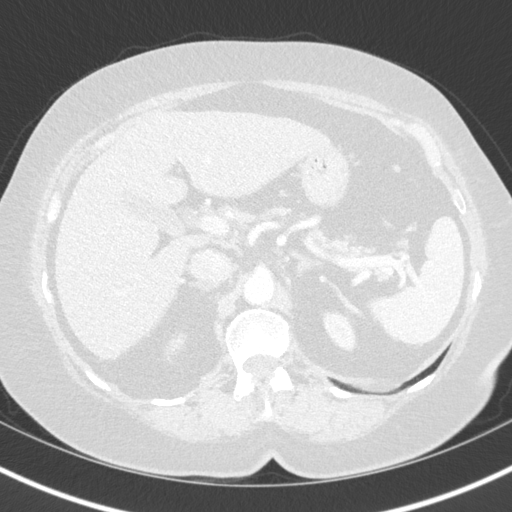
[im 24/162  lung]
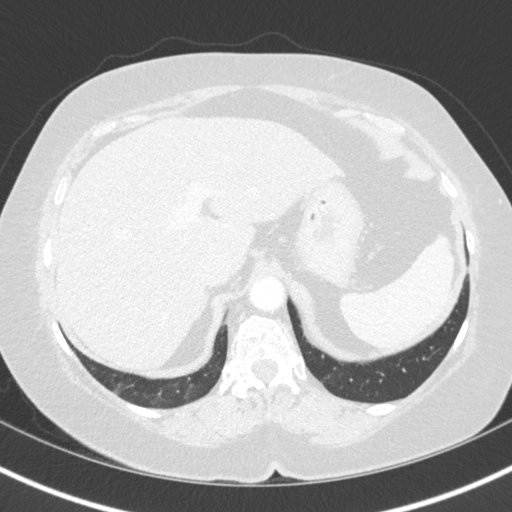
[im 33/162  lung]
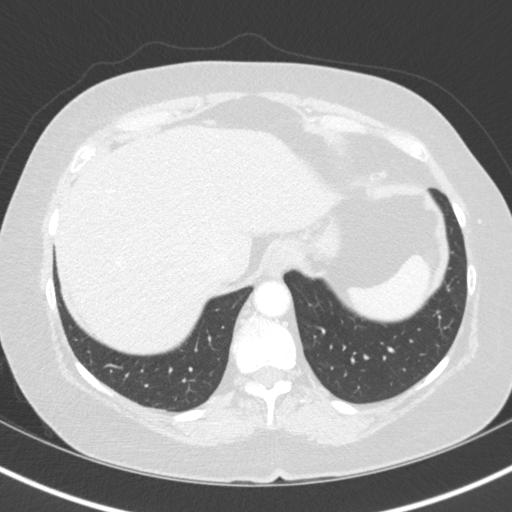
[im 42/162  lung]
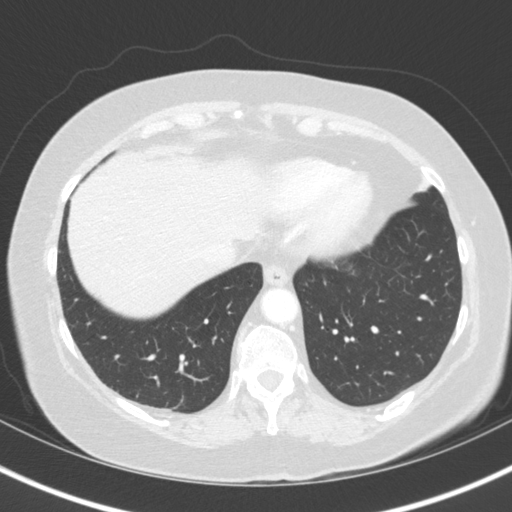
[im 54/162  mediastinal]
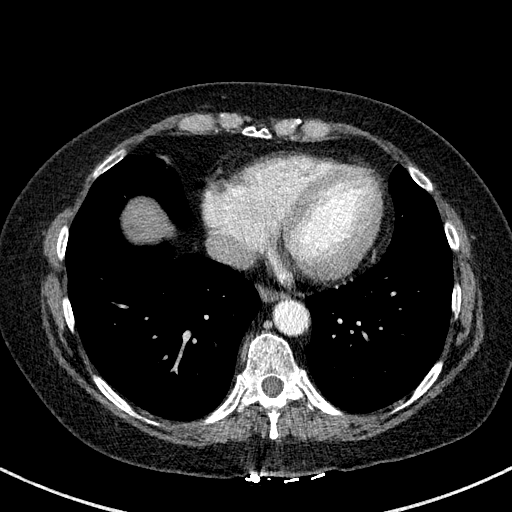
[im 54/162  lung]
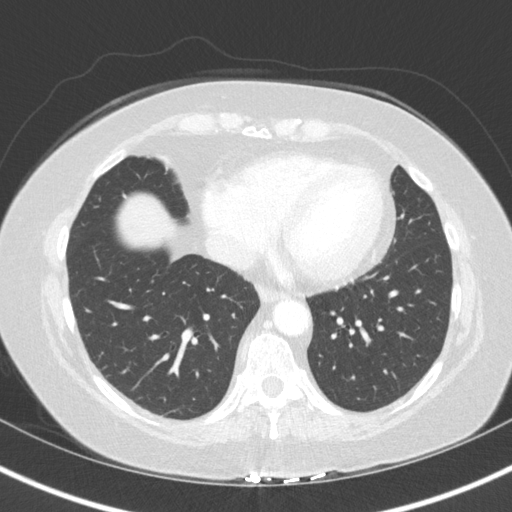
[im 65/162  lung]
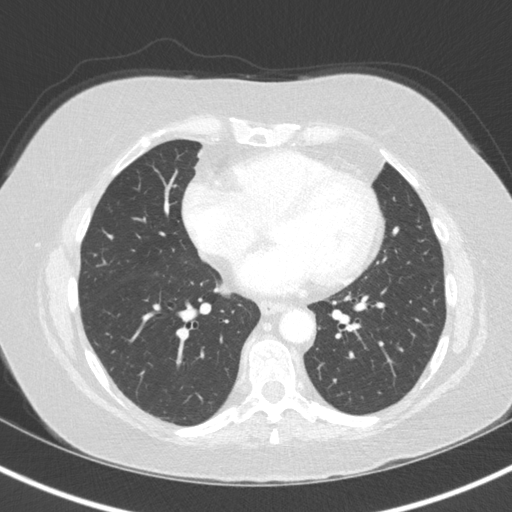
[im 72/162  lung]
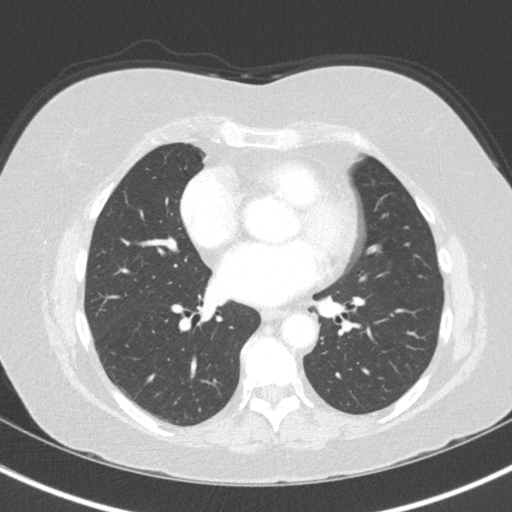
[im 84/162  lung]
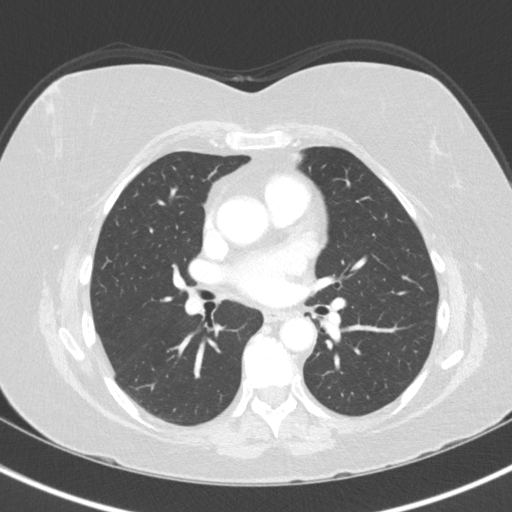
[im 90/162  mediastinal]
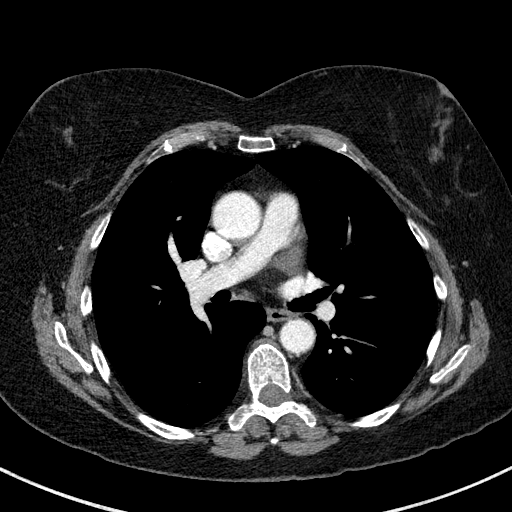
[im 90/162  lung]
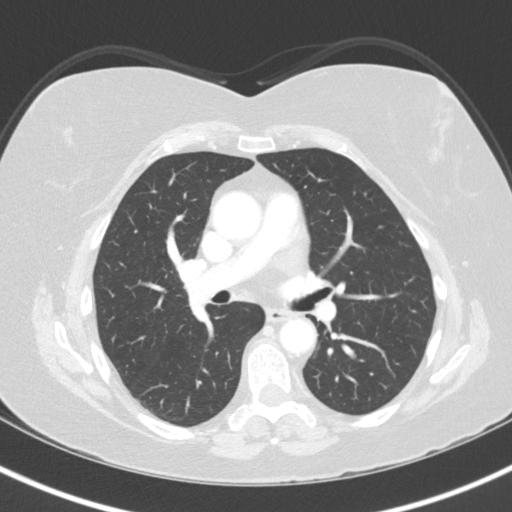
[im 97/162  lung]
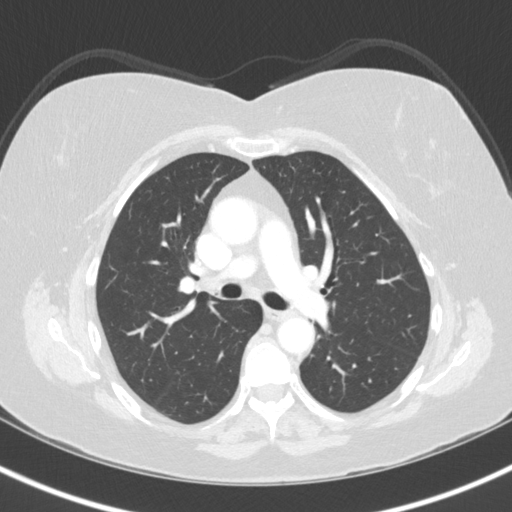
[im 108/162  lung]
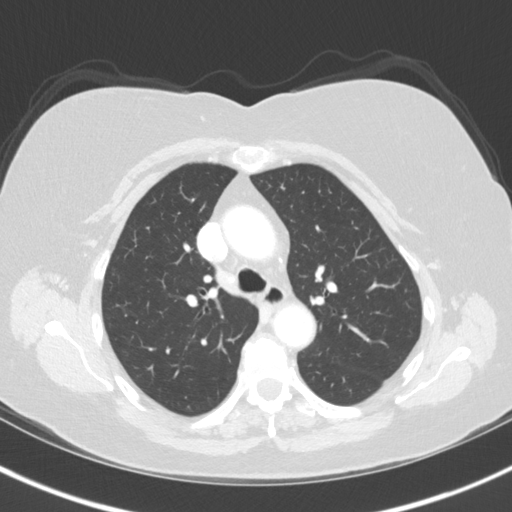
[im 120/162  lung]
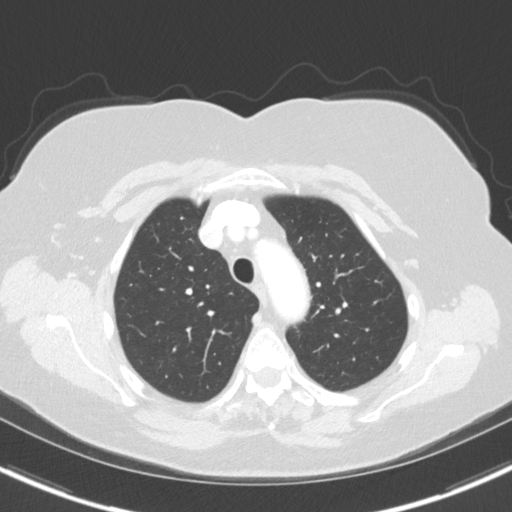
[im 129/162  mediastinal]
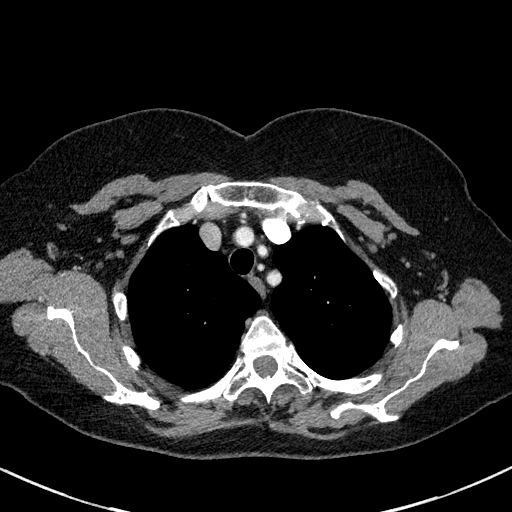
[im 129/162  lung]
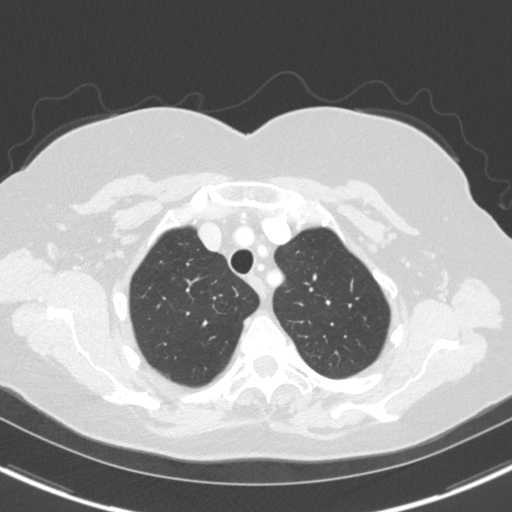
[im 138/162  lung]
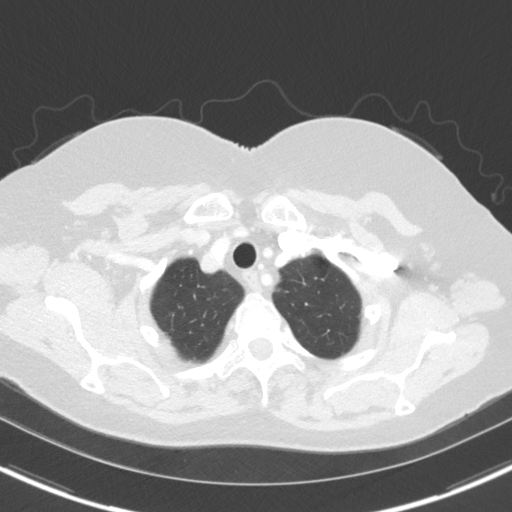
[im 150/162  lung]
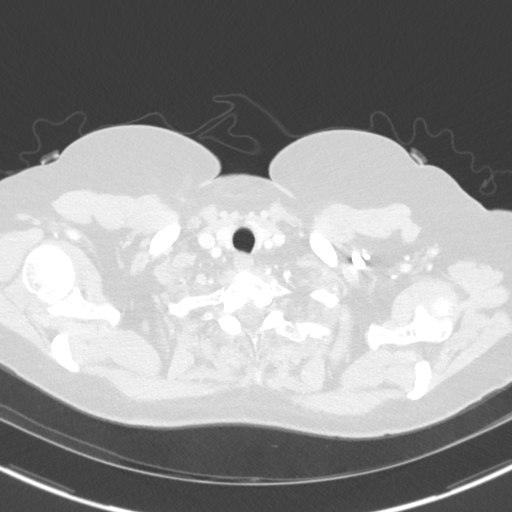

[15 of 34 positions shown; findings below may reference images not displayed]

FINDINGS: Cardiovascular: Minimal calcified and noncalcified plaque of the
thoracic aorta. No signs of aneurysmal dilation. Heart size is
normal without pericardial effusion.

Venous phase appearance of central pulmonary vasculature is
unremarkable.

Mediastinum/Nodes: Thoracic inlet structures are normal. No axillary
lymphadenopathy. No mediastinal lymphadenopathy. No hilar
lymphadenopathy.

Lungs/Pleura: Biapical scarring. Mild. No consolidation. No pleural
effusion. No interstitial thickening on non high-resolution CT
imaging. Airways are patent.

Upper Abdomen: Fissural widening in the liver. Liver is incompletely
imaged, suggesting low-attenuation with limited assessment on
today's evaluation. Signs of portosystemic collaterals are present
along the LEFT hemiabdomen. Upper abdominal contents are
incompletely imaged without acute process.

Musculoskeletal: No acute musculoskeletal process
IMPRESSION: 1. No acute cardiopulmonary process.
2. Nodular liver contour and suggestion of stigmata of portal
hypertension. Correlate with any clinical or laboratory evidence of
liver disease/cirrhosis.
3. Aortic atherosclerosis.

These results will be called to the ordering clinician or
representative by the Radiologist Assistant, and communication
documented in the PACS or [REDACTED].

Aortic Atherosclerosis ([id number]).  It

## 2022-05-12 ENCOUNTER — Other Ambulatory Visit: Payer: Self-pay | Admitting: Internal Medicine

## 2022-05-12 DIAGNOSIS — R0602 Shortness of breath: Secondary | ICD-10-CM

## 2022-05-12 DIAGNOSIS — I2089 Other forms of angina pectoris: Secondary | ICD-10-CM

## 2022-05-19 ENCOUNTER — Telehealth (HOSPITAL_COMMUNITY): Payer: Self-pay | Admitting: Emergency Medicine

## 2022-05-19 DIAGNOSIS — R079 Chest pain, unspecified: Secondary | ICD-10-CM

## 2022-05-19 MED ORDER — METOPROLOL TARTRATE 100 MG PO TABS: 100.0000 mg | ORAL_TABLET | Freq: Once | ORAL | 0 refills | Status: DC

## 2022-05-19 NOTE — Telephone Encounter (Signed)
Reaching out to patient to offer assistance regarding upcoming cardiac imaging study; pt verbalizes understanding of appt date/time, parking situation and where to check in, pre-test NPO status and medications ordered, and verified current allergies; name and call back number provided for further questions should they arise Sreshta Cressler RN Navigator Cardiac Imaging [facility] [phone] office [phone] cell  100mg metoprolol tart  

## 2022-05-20 ENCOUNTER — Ambulatory Visit
Admission: RE | Admit: 2022-05-20 | Discharge: 2022-05-20 | Disposition: A | Discharge status: Home / Self Care | Payer: Medicare HMO | Source: Ambulatory Visit | Attending: Internal Medicine | Admitting: Internal Medicine

## 2022-05-20 DIAGNOSIS — I2089 Other forms of angina pectoris: Secondary | ICD-10-CM | POA: Diagnosis not present

## 2022-05-20 DIAGNOSIS — R0602 Shortness of breath: Secondary | ICD-10-CM | POA: Diagnosis not present

## 2022-05-20 MED ORDER — NITROGLYCERIN 0.4 MG SL SUBL
0.8000 mg | SUBLINGUAL_TABLET | Freq: Once | SUBLINGUAL | Status: AC
  Administered 2022-05-20: 0.8 mg via SUBLINGUAL

## 2022-05-20 MED ORDER — IOHEXOL 350 MG/ML SOLN
75.0000 mL | Freq: Once | INTRAVENOUS | Status: AC | PRN
  Administered 2022-05-20: 75 mL via INTRAVENOUS

## 2022-05-20 MED ORDER — METOPROLOL TARTRATE 5 MG/5ML IV SOLN
10.0000 mg | Freq: Once | INTRAVENOUS | Status: AC
  Administered 2022-05-20: 10 mg via INTRAVENOUS

## 2022-05-20 NOTE — Progress Notes (Signed)
Patient tolerated procedure well. Ambulate w/o difficulty. Denies light headedness or being dizzy. Sitting in chair drinking water provided. Encouraged to drink extra water today and reasoning explained. Verbalized understanding. All questions answered. ABC intact. No further needs. Discharge from procedure area w/o issues.   °

## 2022-05-26 ENCOUNTER — Other Ambulatory Visit: Payer: Self-pay | Admitting: Internal Medicine

## 2022-05-26 DIAGNOSIS — R0602 Shortness of breath: Secondary | ICD-10-CM

## 2022-05-26 DIAGNOSIS — R0609 Other forms of dyspnea: Secondary | ICD-10-CM

## 2022-06-16 ENCOUNTER — Other Ambulatory Visit: Payer: Self-pay

## 2022-06-16 ENCOUNTER — Encounter: Payer: Self-pay | Admitting: Cardiovascular Disease

## 2022-06-16 ENCOUNTER — Ambulatory Visit
Admission: RE | Admit: 2022-06-16 | Discharge: 2022-06-16 | Disposition: A | Discharge status: Home / Self Care | Payer: Medicare HMO | Source: Home / Self Care | Attending: Cardiology | Admitting: Cardiology

## 2022-06-16 ENCOUNTER — Ambulatory Visit: Payer: Medicare HMO | Admitting: Certified Registered"

## 2022-06-16 ENCOUNTER — Ambulatory Visit
Admission: RE | Admit: 2022-06-16 | Discharge: 2022-06-16 | Disposition: A | Discharge status: Home / Self Care | Payer: Medicare HMO | Attending: Cardiovascular Disease | Admitting: Cardiovascular Disease

## 2022-06-16 ENCOUNTER — Encounter
Admission: RE | Disposition: A | Discharge status: Home / Self Care | Payer: Self-pay | Source: Home / Self Care | Attending: Cardiovascular Disease

## 2022-06-16 DIAGNOSIS — I080 Rheumatic disorders of both mitral and aortic valves: Secondary | ICD-10-CM | POA: Insufficient documentation

## 2022-06-16 DIAGNOSIS — E039 Hypothyroidism, unspecified: Secondary | ICD-10-CM | POA: Insufficient documentation

## 2022-06-16 DIAGNOSIS — I509 Heart failure, unspecified: Secondary | ICD-10-CM | POA: Diagnosis not present

## 2022-06-16 DIAGNOSIS — I351 Nonrheumatic aortic (valve) insufficiency: Secondary | ICD-10-CM

## 2022-06-16 DIAGNOSIS — I110 Hypertensive heart disease with heart failure: Secondary | ICD-10-CM | POA: Diagnosis not present

## 2022-06-16 HISTORY — PX: TEE WITHOUT CARDIOVERSION: SHX5443

## 2022-06-16 HISTORY — DX: Heart failure, unspecified: I50.9

## 2022-06-16 HISTORY — DX: Hypothyroidism, unspecified: E03.9

## 2022-06-16 LAB — CBC
HCT: 40.2 % (ref 36.0–46.0)
Hemoglobin: 12.9 g/dL (ref 12.0–15.0)
MCH: 28.3 pg (ref 26.0–34.0)
MCHC: 32.1 g/dL (ref 30.0–36.0)
MCV: 88.2 fL (ref 80.0–100.0)
Platelets: 252 10*3/uL (ref 150–400)
RBC: 4.56 MIL/uL (ref 3.87–5.11)
RDW: 13.5 % (ref 11.5–15.5)
WBC: 8 10*3/uL (ref 4.0–10.5)
nRBC: 0 % (ref 0.0–0.2)

## 2022-06-16 LAB — BASIC METABOLIC PANEL
Anion gap: 9 (ref 5–15)
BUN: 18 mg/dL (ref 8–23)
CO2: 25 mmol/L (ref 22–32)
Calcium: 7.6 mg/dL — ABNORMAL LOW (ref 8.9–10.3)
Chloride: 107 mmol/L (ref 98–111)
Creatinine, Ser: 0.92 mg/dL (ref 0.44–1.00)
GFR, Estimated: >60 mL/min (ref >60)
Glucose, Bld: 102 mg/dL — ABNORMAL HIGH (ref 70–99)
Potassium: 4.5 mmol/L (ref 3.5–5.1)
Sodium: 141 mmol/L (ref 135–145)

## 2022-06-16 LAB — ECHO TEE

## 2022-06-16 SURGERY — ECHOCARDIOGRAM, TRANSESOPHAGEAL
Anesthesia: General

## 2022-06-16 MED ORDER — SODIUM CHLORIDE 0.9 % IV SOLN
INTRAVENOUS | Status: DC
  Administered 2022-06-16: 1000 mL via INTRAVENOUS

## 2022-06-16 MED ORDER — LIDOCAINE HCL (PF) 2 % IJ SOLN
INTRAMUSCULAR | Status: DC | PRN
  Administered 2022-06-16 (×2): 2.5 mL via INTRADERMAL

## 2022-06-16 MED ORDER — BUTAMBEN-TETRACAINE-BENZOCAINE 2-2-14 % EX AERO
3.0000 | INHALATION_SPRAY | Freq: Once | CUTANEOUS | Status: AC
  Administered 2022-06-16: 3 via TOPICAL
  Filled 2022-06-16: qty 20

## 2022-06-16 MED ORDER — PROPOFOL 10 MG/ML IV BOLUS
INTRAVENOUS | Status: DC | PRN
  Administered 2022-06-16: 20 mg via INTRAVENOUS
  Administered 2022-06-16 (×4): 30 mg via INTRAVENOUS
  Administered 2022-06-16: 20 mg via INTRAVENOUS
  Administered 2022-06-16: 50 mg via INTRAVENOUS
  Administered 2022-06-16: 10 mg via INTRAVENOUS
  Administered 2022-06-16: 30 mg via INTRAVENOUS

## 2022-06-16 MED ORDER — BUTAMBEN-TETRACAINE-BENZOCAINE 2-2-14 % EX AERO
INHALATION_SPRAY | CUTANEOUS | Status: AC
  Filled 2022-06-16: qty 5

## 2022-06-16 NOTE — Anesthesia Preprocedure Evaluation (Addendum)
Anesthesia Evaluation  Patient identified by MRN, date of birth, ID band Patient awake    Reviewed: Allergy & Precautions, H&P , NPO status , Patient's Chart, lab work & pertinent test results  Airway Mallampati: III  TM Distance: >3 FB Neck ROM: full    Dental  (+) Caps   Pulmonary shortness of breath and with exertion   Pulmonary exam normal        Cardiovascular Exercise Tolerance: Poor hypertension, +CHF and + DOE  Normal cardiovascular exam  05/2022: INTERPRETATION  NORMAL LEFT VENTRICULAR SYSTOLIC FUNCTION   WITH MILD LVH  NORMAL RIGHT VENTRICULAR SYSTOLIC FUNCTION  MODERATE VALVULAR REGURGITATION (See above)  NO VALVULAR STENOSIS  MODERATAE AR  MILD MR, PR  EF 50%   SOB, DOE, recent echo revealed mild pericardial effusion and moderate aortic regurgitation.  Cardiac MRI reviewed   Neuro/Psych negative neurological ROS  negative psych ROS   GI/Hepatic negative GI ROS, Neg liver ROS,,,  Endo/Other  diabetes, Well Controlled, Type 2Hypothyroidism    Renal/GU negative Renal ROS  negative genitourinary   Musculoskeletal   Abdominal Normal abdominal exam  (+)   Peds  Hematology negative hematology ROS (+)   Anesthesia Other Findings Past Medical History: No date: CHF (congestive heart failure) (HCC) No date: Hypertension No date: Hypothyroidism  History reviewed. No pertinent surgical history.  BMI    Body Mass Index: 29.18 kg/m      Reproductive/Obstetrics negative OB ROS                             Anesthesia Physical Anesthesia Plan  ASA: 3  Anesthesia Plan: General   Post-op Pain Management:    Induction: Intravenous  PONV Risk Score and Plan: Propofol infusion and TIVA  Airway Management Planned: Natural Airway  Additional Equipment:   Intra-op Plan:   Post-operative Plan:   Informed Consent: I have reviewed the patients History and Physical, chart,  labs and discussed the procedure including the risks, benefits and alternatives for the proposed anesthesia with the patient or authorized representative who has indicated his/her understanding and acceptance.     Dental Advisory Given  Plan Discussed with: CRNA and Surgeon  Anesthesia Plan Comments:         Anesthesia Quick Evaluation

## 2022-06-16 NOTE — Progress Notes (Signed)
Transesophageal echocardiogram preliminary report  Mary Daugherty [id number] Mar 24, 1955  Preliminary diagnosis AR  Postprocedural diagnosis same  Time out A timeout was performed by the nursing staff and physicians specifically identifying the procedure performed, identification of the patient, the type of sedation, all allergies and medications, all pertinent medical history, and presedation assessment of nasopharynx. The patient and or family understand the risks of the procedure including the rare risks of death, stroke, heart attack, esophogeal perforation, sore throat, and reaction to medications given.  General anesthesia Anesthesiologist administered GA. See MAR.  250 mg propofol. The patient had continued monitoring of heart rate, oxygenation, blood pressure, respiratory rate, and extent of signs of sedation throughout the entire procedure.  The patient received anesthesia over a period of 20 minutes.  Anesthesia MD, nursing staff and I were present during the procedure when the patient was anesthetized for 100% of the time.  Treatment considerations Per primary cardiologist   For further details of transesophageal echocardiogram please refer to final report.  Lenn Cal [facility] MD [facility] 06/16/2022 12:53 PM

## 2022-06-16 NOTE — Progress Notes (Signed)
*  PRELIMINARY RESULTS* Echocardiogram Echocardiogram Transesophageal has been performed.  Mary Daugherty 06/16/2022, 12:56 PM

## 2022-06-16 NOTE — Transfer of Care (Signed)
Immediate Anesthesia Transfer of Care Note  Patient: Mary Daugherty  Procedure(s) Performed: TRANSESOPHAGEAL ECHOCARDIOGRAM  Patient Location: PACU  Anesthesia Type:General  Level of Consciousness: awake and alert   Airway & Oxygen Therapy: Patient Spontanous Breathing  Post-op Assessment: Report given to RN, Post -op Vital signs reviewed and stable, and Patient moving all extremities  Post vital signs: Reviewed and stable  Last Vitals:  Vitals Value Taken Time  BP 130/78 06/16/22 1251  Temp    Pulse 81 06/16/22 1253  Resp 23 06/16/22 1253  SpO2 100 % 06/16/22 1253  Vitals shown include unvalidated device data.  Last Pain:  Vitals:   06/16/22 1152  TempSrc: Oral         Complications: No notable events documented.

## 2022-06-16 NOTE — H&P (Signed)
Pre-procedure History & Physical    Patient ID: Mary Daugherty MRN: [id number]; DOB: 06-Apr-1955   Date of procedure: 06/16/2022  Primary Care Provider: Danella Penton, MD Primary Cardiologist: None   Planned procedure:  TEE  HPI:   Mary Daugherty is a 67 y.o. female with AR seen on prior transthoracic echo, here for TEE  Past Medical History:  Diagnosis Date   CHF (congestive heart failure) (HCC)    Hypertension    Hypothyroidism     History reviewed. No pertinent surgical history.   Medications Prior to Admission: Prior to Admission medications   Medication Sig Start Date End Date Taking? Authorizing Provider  calcium carbonate (OS-CAL) 600 MG TABS tablet Take by mouth.   Yes [provider]  Cholecalciferol 25 MCG (1000 UT) tablet Take by mouth.   Yes [provider]  Cyanocobalamin (B-12) 1000 MCG LOZG Place under the tongue.   Yes [provider]  raloxifene (EVISTA) 60 MG tablet Take 1 tablet (60 mg total) by mouth daily. 10/03/20  Yes Nadara Mustard, MD  Garlic 10 MG CAPS Take by mouth. Patient not taking: Reported on 06/16/2022    [provider]  hydrochlorothiazide (HYDRODIURIL) 25 MG tablet Take by mouth. Patient not taking: Reported on 06/16/2022 07/11/17 07/11/18  [provider]  levothyroxine (SYNTHROID) 50 MCG tablet Take 50 mcg by mouth. 07/27/19 07/26/20  [provider]  metoprolol succinate (TOPROL-XL) 50 MG 24 hr tablet Take by mouth. 08/08/18 08/08/19  [provider]  metoprolol tartrate (LOPRESSOR) 100 MG tablet Take 1 tablet (100 mg total) by mouth once for 1 dose. Please take one time dose 100mg  metoprolol tartrate 2 hr prior to cardiac CT for HR control IF HR >55bpm. 05/19/22 05/19/22  Debbe Odea, MD  Omega-3 Fatty Acids (FISH OIL PO) Take by mouth. Patient not taking: Reported on 06/16/2022    [provider]  triamterene-hydrochlorothiazide (DYAZIDE) 37.5-25 MG capsule Take 1  capsule by mouth every morning. Patient not taking: Reported on 06/16/2022 09/09/20   [provider]     Allergies:   No Known Allergies  Social History:   Social History   Socioeconomic History   Marital status: Married    Spouse name: Not on file   Number of children: Not on file   Years of education: Not on file   Highest education level: Not on file  Occupational History   Not on file  Tobacco Use   Smoking status: Never   Smokeless tobacco: Never  Vaping Use   Vaping Use: Never used  Substance and Sexual Activity   Alcohol use: No   Drug use: No   Sexual activity: Yes    Birth control/protection: None  Other Topics Concern   Not on file  Social History Narrative   Not on file   Social Determinants of Health   Financial Resource Strain: Not on file  Food Insecurity: Not on file  Transportation Needs: Not on file  Physical Activity: Not on file  Stress: Not on file  Social Connections: Not on file  Intimate Partner Violence: Not on file     Family History:   The patient's family history includes Lung cancer in her mother.    ROS:  Please see the history of present illness.  All other ROS reviewed and negative.     Physical Exam/Data:   Vitals:   06/16/22 1152  BP: (!) 155/71  Pulse: 70  Temp: 98.8 F (37.1 C)  TempSrc: Oral  SpO2: 98%  Weight: 77.1 kg  Height: 5\' 4"  (1.626 m)   No intake or output data in the 24 hours ending 06/16/22 1214    06/16/2022   11:52 AM 10/03/2020    2:35 PM 10/02/2019    3:16 PM  Last 3 Weights  Weight (lbs) 170 lb 172 lb 194 lb  Weight (kg) 77.111 kg 78.019 kg 87.998 kg     Body mass index is 29.18 kg/m.  Wt Readings from Last 3 Encounters:  06/16/22 77.1 kg  10/03/20 78 kg  10/02/19 88 kg    Physical Exam: General: no acute distress. Head: Normocephalic, atraumatic  Neck: supple Lungs: nl effort Heart: RRR, nl s1 s1, +systolic/diastolic murmur Abdomen: Soft Msk:  Strength and tone appear  normal for age. Extremities: no edema Neuro: awake and alert Psych:  Responds to questions appropriately with a normal affect.    Laboratory Data:  Chemistry Recent Labs  Lab 06/16/22 1139  NA 141  K 4.5  CL 107  CO2 25  GLUCOSE 102*  BUN 18  CREATININE 0.92  CALCIUM 7.6*  GFRNONAA >60  ANIONGAP 9    No results for input(s): "PROT", "ALBUMIN", "AST", "ALT", "ALKPHOS", "BILITOT" in the last 168 hours. Hematology Recent Labs  Lab 06/16/22 1139  WBC 8.0  RBC 4.56  HGB 12.9  HCT 40.2  MCV 88.2  MCH 28.3  MCHC 32.1  RDW 13.5  PLT 252    Assessment and Plan   Will proceed w/ TEE  ASA III  Anesthesia to be administered by CRNA  Signed, Tiajuana Amass, MD 06/16/2022, 12:14 PM

## 2022-06-17 ENCOUNTER — Encounter: Payer: Self-pay | Admitting: Cardiovascular Disease

## 2022-06-17 NOTE — Anesthesia Postprocedure Evaluation (Signed)
Anesthesia Post Note  Patient: Mary Daugherty  Procedure(s) Performed: TRANSESOPHAGEAL ECHOCARDIOGRAM  Patient location during evaluation: PACU Anesthesia Type: General Level of consciousness: awake and alert Pain management: pain level controlled Vital Signs Assessment: post-procedure vital signs reviewed and stable Respiratory status: spontaneous breathing, nonlabored ventilation and respiratory function stable Cardiovascular status: blood pressure returned to baseline and stable Postop Assessment: no apparent nausea or vomiting Anesthetic complications: no   No notable events documented.   Last Vitals:  Vitals:   06/16/22 1330 06/16/22 1345  BP: (!) 144/71 (!) 147/57  Pulse: 66 (!) 58  Resp: 14 12  Temp:    SpO2: 99% 98%    Last Pain:  Vitals:   06/16/22 1345  TempSrc:   PainSc: 0-No pain                 Foye Deer

## 2022-06-21 ENCOUNTER — Other Ambulatory Visit
Admission: RE | Admit: 2022-06-21 | Discharge: 2022-06-21 | Disposition: A | Discharge status: Home / Self Care | Payer: Medicare HMO | Source: Ambulatory Visit | Attending: Pulmonary Disease | Admitting: Pulmonary Disease

## 2022-06-21 DIAGNOSIS — R0789 Other chest pain: Secondary | ICD-10-CM | POA: Insufficient documentation

## 2022-06-21 DIAGNOSIS — R0602 Shortness of breath: Secondary | ICD-10-CM | POA: Insufficient documentation

## 2022-06-21 LAB — D-DIMER, QUANTITATIVE: D-Dimer, Quant: 0.28 ug/mL-FEU (ref 0.00–0.50)

## 2022-06-22 ENCOUNTER — Telehealth (HOSPITAL_COMMUNITY): Payer: Self-pay | Admitting: Emergency Medicine

## 2022-06-22 NOTE — Telephone Encounter (Signed)
Reaching out to patient to offer assistance regarding upcoming cardiac imaging study; pt verbalizes understanding of appt date/time, parking situation and where to check in, pre-test NPO status and medications ordered, and verified current allergies; name and call back number provided for further questions should they arise Djuana Littleton RN Navigator Cardiac Imaging [facility] [phone] office [phone] cell 

## 2022-06-23 ENCOUNTER — Other Ambulatory Visit: Payer: Self-pay | Admitting: Internal Medicine

## 2022-06-23 ENCOUNTER — Other Ambulatory Visit: Payer: Self-pay | Admitting: Pulmonary Disease

## 2022-06-23 ENCOUNTER — Ambulatory Visit
Admission: RE | Admit: 2022-06-23 | Discharge: 2022-06-23 | Disposition: A | Discharge status: Home / Self Care | Payer: Medicare HMO | Source: Ambulatory Visit | Attending: Internal Medicine | Admitting: Internal Medicine

## 2022-06-23 DIAGNOSIS — R0609 Other forms of dyspnea: Secondary | ICD-10-CM

## 2022-06-23 DIAGNOSIS — R0602 Shortness of breath: Secondary | ICD-10-CM | POA: Diagnosis present

## 2022-06-23 DIAGNOSIS — R0789 Other chest pain: Secondary | ICD-10-CM

## 2022-06-23 DIAGNOSIS — I351 Nonrheumatic aortic (valve) insufficiency: Secondary | ICD-10-CM | POA: Diagnosis not present

## 2022-06-23 MED ORDER — GADOBUTROL 1 MMOL/ML IV SOLN
10.0000 mL | Freq: Once | INTRAVENOUS | Status: AC | PRN
  Administered 2022-06-23: 10 mL via INTRAVENOUS

## 2022-06-28 ENCOUNTER — Ambulatory Visit
Admission: RE | Admit: 2022-06-28 | Discharge: 2022-06-28 | Disposition: A | Discharge status: Home / Self Care | Payer: Medicare HMO | Source: Ambulatory Visit | Attending: Pulmonary Disease | Admitting: Pulmonary Disease

## 2022-06-28 DIAGNOSIS — R0789 Other chest pain: Secondary | ICD-10-CM

## 2022-06-28 DIAGNOSIS — R0602 Shortness of breath: Secondary | ICD-10-CM

## 2022-06-28 MED ORDER — IOHEXOL 350 MG/ML SOLN
75.0000 mL | Freq: Once | INTRAVENOUS | Status: AC | PRN
  Administered 2022-06-28: 75 mL via INTRAVENOUS

## 2022-12-22 ENCOUNTER — Ambulatory Visit: Payer: Medicare HMO

## 2022-12-22 DIAGNOSIS — K640 First degree hemorrhoids: Secondary | ICD-10-CM

## 2022-12-22 DIAGNOSIS — D123 Benign neoplasm of transverse colon: Secondary | ICD-10-CM

## 2022-12-22 DIAGNOSIS — D122 Benign neoplasm of ascending colon: Secondary | ICD-10-CM | POA: Diagnosis not present

## 2022-12-22 DIAGNOSIS — Z09 Encounter for follow-up examination after completed treatment for conditions other than malignant neoplasm: Secondary | ICD-10-CM | POA: Diagnosis present

## 2022-12-22 DIAGNOSIS — Z860100 Personal history of colon polyps, unspecified: Secondary | ICD-10-CM

## 2022-12-22 DIAGNOSIS — K621 Rectal polyp: Secondary | ICD-10-CM

## 2022-12-22 DIAGNOSIS — K5730 Diverticulosis of large intestine without perforation or abscess without bleeding: Secondary | ICD-10-CM

## 2022-12-22 DIAGNOSIS — K635 Polyp of colon: Secondary | ICD-10-CM | POA: Diagnosis not present
